# Patient Record
Sex: Male | Born: 1965 | Race: White | Hispanic: No | Marital: Single | Smoking: Current every day smoker
Health system: Southern US, Community
[De-identification: ages and names within clinical notes are randomized; demographics above are authoritative.]

## PROBLEM LIST (undated history)

## (undated) HISTORY — PX: MENISCUS REPAIR: SHX5179

---

## 2016-11-08 ENCOUNTER — Emergency Department (HOSPITAL_COMMUNITY): Payer: BLUE CROSS/BLUE SHIELD | Admitting: Anesthesiology

## 2016-11-08 ENCOUNTER — Encounter (HOSPITAL_COMMUNITY)
Admission: EM | Disposition: A | Discharge status: Home / Self Care | Payer: Self-pay | Source: Home / Self Care | Attending: Emergency Medicine

## 2016-11-08 ENCOUNTER — Encounter (HOSPITAL_COMMUNITY): Payer: Self-pay | Admitting: Emergency Medicine

## 2016-11-08 ENCOUNTER — Observation Stay (HOSPITAL_COMMUNITY)
Admission: EM | Admit: 2016-11-08 | Discharge: 2016-11-09 | Disposition: A | Discharge status: Home / Self Care | Payer: BLUE CROSS/BLUE SHIELD | Attending: General Surgery | Admitting: General Surgery

## 2016-11-08 ENCOUNTER — Emergency Department (HOSPITAL_COMMUNITY): Payer: BLUE CROSS/BLUE SHIELD

## 2016-11-08 DIAGNOSIS — F17210 Nicotine dependence, cigarettes, uncomplicated: Secondary | ICD-10-CM | POA: Diagnosis not present

## 2016-11-08 DIAGNOSIS — R1031 Right lower quadrant pain: Secondary | ICD-10-CM | POA: Diagnosis present

## 2016-11-08 DIAGNOSIS — K352 Acute appendicitis with generalized peritonitis, without abscess: Secondary | ICD-10-CM

## 2016-11-08 DIAGNOSIS — K3580 Unspecified acute appendicitis: Secondary | ICD-10-CM | POA: Diagnosis present

## 2016-11-08 DIAGNOSIS — I10 Essential (primary) hypertension: Secondary | ICD-10-CM | POA: Diagnosis not present

## 2016-11-08 HISTORY — PX: LAPAROSCOPIC APPENDECTOMY: SHX408

## 2016-11-08 LAB — CBC
HCT: 48.9 % (ref 39.0–52.0)
Hemoglobin: 16.5 g/dL (ref 13.0–17.0)
MCH: 31.3 pg (ref 26.0–34.0)
MCHC: 33.7 g/dL (ref 30.0–36.0)
MCV: 92.6 fL (ref 78.0–100.0)
PLATELETS: 333 10*3/uL (ref 150–400)
RBC: 5.28 MIL/uL (ref 4.22–5.81)
RDW: 13.6 % (ref 11.5–15.5)
WBC: 30.3 10*3/uL — AB (ref 4.0–10.5)

## 2016-11-08 LAB — COMPREHENSIVE METABOLIC PANEL
ALK PHOS: 74 U/L (ref 38–126)
ALT: 37 U/L (ref 17–63)
AST: 24 U/L (ref 15–41)
Albumin: 4.6 g/dL (ref 3.5–5.0)
Anion gap: 11 (ref 5–15)
BUN: 10 mg/dL (ref 6–20)
CALCIUM: 9.3 mg/dL (ref 8.9–10.3)
CO2: 25 mmol/L (ref 22–32)
CREATININE: 0.94 mg/dL (ref 0.61–1.24)
Chloride: 97 mmol/L — ABNORMAL LOW (ref 101–111)
Glucose, Bld: 106 mg/dL — ABNORMAL HIGH (ref 65–99)
Potassium: 4 mmol/L (ref 3.5–5.1)
Sodium: 133 mmol/L — ABNORMAL LOW (ref 135–145)
Total Bilirubin: 0.9 mg/dL (ref 0.3–1.2)
Total Protein: 7.5 g/dL (ref 6.5–8.1)

## 2016-11-08 LAB — URINALYSIS, ROUTINE W REFLEX MICROSCOPIC
Bilirubin Urine: NEGATIVE
Glucose, UA: NEGATIVE mg/dL
Hgb urine dipstick: NEGATIVE
Ketones, ur: NEGATIVE mg/dL
LEUKOCYTES UA: NEGATIVE
NITRITE: NEGATIVE
PROTEIN: NEGATIVE mg/dL
Specific Gravity, Urine: 1.013 (ref 1.005–1.030)
pH: 5 (ref 5.0–8.0)

## 2016-11-08 LAB — LIPASE, BLOOD: Lipase: 14 U/L (ref 11–51)

## 2016-11-08 SURGERY — APPENDECTOMY, LAPAROSCOPIC
Anesthesia: General | Site: Abdomen

## 2016-11-08 MED ORDER — ONDANSETRON 4 MG PO TBDP: 4.0000 mg | ORAL_TABLET | Freq: Four times a day (QID) | ORAL | Status: DC | PRN

## 2016-11-08 MED ORDER — PROPOFOL 10 MG/ML IV BOLUS
INTRAVENOUS | Status: AC
  Filled 2016-11-08: qty 20

## 2016-11-08 MED ORDER — HYDROMORPHONE HCL 1 MG/ML IJ SOLN: 1.0000 mg | INTRAMUSCULAR | Status: DC | PRN

## 2016-11-08 MED ORDER — ONDANSETRON HCL 4 MG/2ML IJ SOLN
INTRAMUSCULAR | Status: DC | PRN
  Administered 2016-11-08: 4 mg via INTRAVENOUS

## 2016-11-08 MED ORDER — DEXAMETHASONE SODIUM PHOSPHATE 4 MG/ML IJ SOLN
INTRAMUSCULAR | Status: DC | PRN
  Administered 2016-11-08: 4 mg via INTRAVENOUS

## 2016-11-08 MED ORDER — OXYCODONE-ACETAMINOPHEN 5-325 MG PO TABS: 1.0000 | ORAL_TABLET | ORAL | Status: DC | PRN

## 2016-11-08 MED ORDER — PROPOFOL 10 MG/ML IV BOLUS
INTRAVENOUS | Status: DC | PRN
Start: 2016-11-08 — End: 2016-11-08
  Administered 2016-11-08: 150 mg via INTRAVENOUS

## 2016-11-08 MED ORDER — SODIUM CHLORIDE 0.9 % IR SOLN
Status: DC | PRN
  Administered 2016-11-08: 1000 mL

## 2016-11-08 MED ORDER — CHLORHEXIDINE GLUCONATE CLOTH 2 % EX PADS: 6.0000 | MEDICATED_PAD | Freq: Once | CUTANEOUS | Status: DC

## 2016-11-08 MED ORDER — MIDAZOLAM HCL 5 MG/5ML IJ SOLN
INTRAMUSCULAR | Status: DC | PRN
  Administered 2016-11-08 (×2): 2 mg via INTRAVENOUS

## 2016-11-08 MED ORDER — BUPIVACAINE HCL (PF) 0.5 % IJ SOLN
INTRAMUSCULAR | Status: AC
  Filled 2016-11-08: qty 30

## 2016-11-08 MED ORDER — PIPERACILLIN-TAZOBACTAM 3.375 G IVPB 30 MIN
3.3750 g | Freq: Once | INTRAVENOUS | Status: AC
  Administered 2016-11-08: 3.375 g via INTRAVENOUS
  Filled 2016-11-08: qty 50

## 2016-11-08 MED ORDER — SIMETHICONE 80 MG PO CHEW: 40.0000 mg | CHEWABLE_TABLET | Freq: Four times a day (QID) | ORAL | Status: DC | PRN

## 2016-11-08 MED ORDER — ROCURONIUM BROMIDE 100 MG/10ML IV SOLN
INTRAVENOUS | Status: DC | PRN
  Administered 2016-11-08: 10 mg via INTRAVENOUS
  Administered 2016-11-08: 20 mg via INTRAVENOUS
  Administered 2016-11-08: 10 mg via INTRAVENOUS

## 2016-11-08 MED ORDER — FENTANYL CITRATE (PF) 100 MCG/2ML IJ SOLN
INTRAMUSCULAR | Status: DC | PRN
  Administered 2016-11-08 (×2): 100 ug via INTRAVENOUS

## 2016-11-08 MED ORDER — PIPERACILLIN-TAZOBACTAM 3.375 G IVPB
3.3750 g | Freq: Three times a day (TID) | INTRAVENOUS | Status: DC
  Administered 2016-11-08: 3.375 g via INTRAVENOUS
  Filled 2016-11-08 (×2): qty 50

## 2016-11-08 MED ORDER — FENTANYL CITRATE (PF) 250 MCG/5ML IJ SOLN
INTRAMUSCULAR | Status: AC
  Filled 2016-11-08: qty 5

## 2016-11-08 MED ORDER — ENOXAPARIN SODIUM 40 MG/0.4ML ~~LOC~~ SOLN: 40.0000 mg | SUBCUTANEOUS | Status: DC

## 2016-11-08 MED ORDER — DIPHENHYDRAMINE HCL 25 MG PO CAPS: 25.0000 mg | ORAL_CAPSULE | Freq: Four times a day (QID) | ORAL | Status: DC | PRN

## 2016-11-08 MED ORDER — LORAZEPAM 2 MG/ML IJ SOLN: 1.0000 mg | INTRAMUSCULAR | Status: DC | PRN

## 2016-11-08 MED ORDER — LACTATED RINGERS IV SOLN
INTRAVENOUS | Status: DC | PRN
  Administered 2016-11-08: 19:00:00 via INTRAVENOUS

## 2016-11-08 MED ORDER — BUPIVACAINE HCL (PF) 0.5 % IJ SOLN
INTRAMUSCULAR | Status: DC | PRN
  Administered 2016-11-08: 10 mL

## 2016-11-08 MED ORDER — METOCLOPRAMIDE HCL 5 MG/ML IJ SOLN
INTRAMUSCULAR | Status: DC | PRN
  Administered 2016-11-08: 30 mg via INTRAVENOUS

## 2016-11-08 MED ORDER — DIPHENHYDRAMINE HCL 50 MG/ML IJ SOLN: 25.0000 mg | Freq: Four times a day (QID) | INTRAMUSCULAR | Status: DC | PRN

## 2016-11-08 MED ORDER — LACTATED RINGERS IV SOLN
INTRAVENOUS | Status: DC
  Administered 2016-11-08: 23:00:00 via INTRAVENOUS

## 2016-11-08 MED ORDER — ONDANSETRON HCL 4 MG/2ML IJ SOLN: 4.0000 mg | Freq: Four times a day (QID) | INTRAMUSCULAR | Status: DC | PRN

## 2016-11-08 MED ORDER — HYDROMORPHONE HCL 1 MG/ML IJ SOLN
1.0000 mg | Freq: Once | INTRAMUSCULAR | Status: DC
  Filled 2016-11-08: qty 1

## 2016-11-08 MED ORDER — MIDAZOLAM HCL 2 MG/2ML IJ SOLN
INTRAMUSCULAR | Status: AC
  Filled 2016-11-08: qty 4

## 2016-11-08 MED ORDER — HEMOSTATIC AGENTS (NO CHARGE) OPTIME
TOPICAL | Status: DC | PRN
Start: 2016-11-08 — End: 2016-11-08
  Administered 2016-11-08: 1 via TOPICAL

## 2016-11-08 MED ORDER — HYDROMORPHONE HCL 1 MG/ML IJ SOLN
0.5000 mg | Freq: Once | INTRAMUSCULAR | Status: AC
  Administered 2016-11-08: 0.5 mg via INTRAVENOUS
  Filled 2016-11-08: qty 1

## 2016-11-08 MED ORDER — ACETAMINOPHEN 325 MG PO TABS: 650.0000 mg | ORAL_TABLET | Freq: Four times a day (QID) | ORAL | Status: DC | PRN

## 2016-11-08 MED ORDER — POVIDONE-IODINE 10 % OINT PACKET
TOPICAL_OINTMENT | CUTANEOUS | Status: DC | PRN
  Administered 2016-11-08: 1 via TOPICAL

## 2016-11-08 MED ORDER — SUCCINYLCHOLINE CHLORIDE 20 MG/ML IJ SOLN
INTRAMUSCULAR | Status: DC | PRN
  Administered 2016-11-08: 150 mg via INTRAVENOUS

## 2016-11-08 MED ORDER — IOPAMIDOL (ISOVUE-300) INJECTION 61%
100.0000 mL | Freq: Once | INTRAVENOUS | Status: AC | PRN
  Administered 2016-11-08: 100 mL via INTRAVENOUS

## 2016-11-08 MED ORDER — NEOSTIGMINE METHYLSULFATE 10 MG/10ML IV SOLN
INTRAVENOUS | Status: AC
  Filled 2016-11-08: qty 1

## 2016-11-08 MED ORDER — KETOROLAC TROMETHAMINE 30 MG/ML IJ SOLN
30.0000 mg | Freq: Once | INTRAMUSCULAR | Status: AC
Start: 2016-11-08 — End: 2016-11-08
  Administered 2016-11-08: 30 mg via INTRAVENOUS
  Filled 2016-11-08: qty 1

## 2016-11-08 MED ORDER — HYDROMORPHONE HCL 1 MG/ML IJ SOLN
0.5000 mg | Freq: Once | INTRAMUSCULAR | Status: AC
  Administered 2016-11-08: 0.5 mg via INTRAVENOUS

## 2016-11-08 MED ORDER — PROCHLORPERAZINE EDISYLATE 5 MG/ML IJ SOLN
5.0000 mg | Freq: Once | INTRAMUSCULAR | Status: AC
  Administered 2016-11-08: 5 mg via INTRAVENOUS
  Filled 2016-11-08: qty 2

## 2016-11-08 MED ORDER — LIDOCAINE HCL 1 % IJ SOLN
INTRAMUSCULAR | Status: DC | PRN
  Administered 2016-11-08: 30 mg via INTRADERMAL

## 2016-11-08 MED ORDER — ACETAMINOPHEN 650 MG RE SUPP: 650.0000 mg | Freq: Four times a day (QID) | RECTAL | Status: DC | PRN

## 2016-11-08 MED ORDER — SODIUM CHLORIDE 0.9 % IV SOLN
1000.0000 mL | INTRAVENOUS | Status: DC
  Administered 2016-11-08: 1000 mL via INTRAVENOUS

## 2016-11-08 MED ORDER — SODIUM CHLORIDE 0.9 % IV SOLN
1000.0000 mL | Freq: Once | INTRAVENOUS | Status: AC
  Administered 2016-11-08: 1000 mL via INTRAVENOUS

## 2016-11-08 MED ORDER — NEOSTIGMINE METHYLSULFATE 10 MG/10ML IV SOLN
INTRAVENOUS | Status: DC | PRN
  Administered 2016-11-08 (×2): 2 mg via INTRAVENOUS

## 2016-11-08 MED ORDER — IOPAMIDOL (ISOVUE-300) INJECTION 61%
INTRAVENOUS | Status: AC
Start: 2016-11-08 — End: 2016-11-09
  Filled 2016-11-08: qty 30

## 2016-11-08 MED ORDER — POVIDONE-IODINE 10 % EX OINT
TOPICAL_OINTMENT | CUTANEOUS | Status: AC
  Filled 2016-11-08: qty 1

## 2016-11-08 SURGICAL SUPPLY — 47 items
APPLICATOR ARISTA FLEXITIP XL (MISCELLANEOUS) ×3 IMPLANT
BAG HAMPER (MISCELLANEOUS) ×3 IMPLANT
CHLORAPREP W/TINT 26ML (MISCELLANEOUS) ×3 IMPLANT
CLOTH BEACON ORANGE TIMEOUT ST (SAFETY) ×3 IMPLANT
COVER LIGHT HANDLE STERIS (MISCELLANEOUS) ×6 IMPLANT
CUTTER FLEX LINEAR 45M (STAPLE) ×3 IMPLANT
DECANTER SPIKE VIAL GLASS SM (MISCELLANEOUS) ×3 IMPLANT
ELECT REM PT RETURN 9FT ADLT (ELECTROSURGICAL) ×3
ELECTRODE REM PT RTRN 9FT ADLT (ELECTROSURGICAL) ×1 IMPLANT
EVACUATOR SMOKE 8.L (FILTER) ×3 IMPLANT
FORMALIN 10 PREFIL 120ML (MISCELLANEOUS) ×3 IMPLANT
GLOVE BIOGEL PI IND STRL 7.0 (GLOVE) ×1 IMPLANT
GLOVE BIOGEL PI INDICATOR 7.0 (GLOVE) ×2
GLOVE ECLIPSE 6.5 STRL STRAW (GLOVE) ×3 IMPLANT
GLOVE EXAM NITRILE MD LF STRL (GLOVE) ×3 IMPLANT
GLOVE SURG SS PI 7.5 STRL IVOR (GLOVE) ×3 IMPLANT
GOWN STRL REUS W/ TWL XL LVL3 (GOWN DISPOSABLE) ×1 IMPLANT
GOWN STRL REUS W/TWL LRG LVL3 (GOWN DISPOSABLE) ×3 IMPLANT
GOWN STRL REUS W/TWL XL LVL3 (GOWN DISPOSABLE) ×2
HEMOSTAT ARISTA ABSORB 1G (MISCELLANEOUS) ×3 IMPLANT
INST SET LAPROSCOPIC AP (KITS) ×3 IMPLANT
IV NS IRRIG 3000ML ARTHROMATIC (IV SOLUTION) IMPLANT
KIT ROOM TURNOVER APOR (KITS) ×3 IMPLANT
MANIFOLD NEPTUNE II (INSTRUMENTS) ×3 IMPLANT
NEEDLE INSUFFLATION 14GA 120MM (NEEDLE) ×3 IMPLANT
NS IRRIG 1000ML POUR BTL (IV SOLUTION) ×3 IMPLANT
PACK LAP CHOLE LZT030E (CUSTOM PROCEDURE TRAY) ×3 IMPLANT
PAD ARMBOARD 7.5X6 YLW CONV (MISCELLANEOUS) ×3 IMPLANT
PENCIL HANDSWITCHING (ELECTRODE) ×6 IMPLANT
POUCH SPECIMEN RETRIEVAL 10MM (ENDOMECHANICALS) ×3 IMPLANT
RELOAD 45 VASCULAR/THIN (ENDOMECHANICALS) ×3 IMPLANT
RELOAD STAPLE TA45 3.5 REG BLU (ENDOMECHANICALS) IMPLANT
SET BASIN LINEN APH (SET/KITS/TRAYS/PACK) ×3 IMPLANT
SET TUBE IRRIG SUCTION NO TIP (IRRIGATION / IRRIGATOR) IMPLANT
SHEARS HARMONIC ACE PLUS 36CM (ENDOMECHANICALS) ×3 IMPLANT
SPONGE GAUZE 2X2 8PLY STER LF (GAUZE/BANDAGES/DRESSINGS) ×3
SPONGE GAUZE 2X2 8PLY STRL LF (GAUZE/BANDAGES/DRESSINGS) ×6 IMPLANT
STAPLER VISISTAT (STAPLE) ×3 IMPLANT
SUT VICRYL 0 UR6 27IN ABS (SUTURE) ×3 IMPLANT
TAPE CLOTH SURG 4X10 WHT LF (GAUZE/BANDAGES/DRESSINGS) ×3 IMPLANT
TRAY FOLEY CATH SILVER 16FR (SET/KITS/TRAYS/PACK) ×3 IMPLANT
TROCAR ENDO BLADELESS 11MM (ENDOMECHANICALS) ×3 IMPLANT
TROCAR ENDO BLADELESS 12MM (ENDOMECHANICALS) ×3 IMPLANT
TROCAR XCEL NON-BLD 5MMX100MML (ENDOMECHANICALS) ×3 IMPLANT
TUBING INSUFFLATION (TUBING) ×3 IMPLANT
WARMER LAPAROSCOPE (MISCELLANEOUS) ×3 IMPLANT
YANKAUER SUCT 12FT TUBE ARGYLE (SUCTIONS) ×3 IMPLANT

## 2016-11-08 NOTE — ED Provider Notes (Signed)
[facility] DEPT Provider Note   CSN: [id number] Arrival date & time: 11/08/16  1214     History   Chief Complaint Chief Complaint  Patient presents with  . Abdominal Pain    HPI Tyrone Sanchez is a 51 y.o. male.  Patient is a 51 year old male who presents to the emergency department with a complaint of abdominal pain.  The patient states that his pain started around midnight on mostly in the middle of his abdomen. Around 6:30 in the morning the pain went to the right and left lower quadrant and then settled in the right lower quadrant. The pain was intense for approximately 40 minutes, and then it settled down. The patient was able to drive for couple of hours he made it to the emergency department and then the pain began to bother him again on. That has now subsided some. The patient denies vomiting or diarrhea, he has had some mild nausea. There's been no fever reported. It is of note that the patient had flu symptoms on January 9, but he states these have resolved. He's not seen any blood in his stool. He had no bowel movement today which is unusual for him, as he usually goes daily. He's not had any abdominal surgery. He denies having any foods that he thought were ill prepared with spoiled. He's had no previous history of abdominal pain. No other pain at this time. He presents now for assistance with this issue.   The history is provided by the patient.  Abdominal Pain   Associated symptoms include nausea. Pertinent negatives include diarrhea, vomiting, dysuria, frequency, hematuria and arthralgias.    History reviewed. No pertinent past medical history.  There are no active problems to display for this patient.   Past Surgical History:  Procedure Laterality Date  . MENISCUS REPAIR         Home Medications    Prior to Admission medications   Not on File    Family History History reviewed. No pertinent family history.  Social History Social History  Substance  Use Topics  . Smoking status: Current Every Day Smoker    Packs/day: 1.00    Types: Cigarettes  . Smokeless tobacco: Not on file  . Alcohol use Yes     Comment: occ     Allergies   Patient has no known allergies.   Review of Systems Review of Systems  Constitutional: Negative for activity change.       All ROS Neg except as noted in HPI  HENT: Negative for nosebleeds.   Eyes: Negative for photophobia and discharge.  Respiratory: Negative for cough, shortness of breath and wheezing.   Cardiovascular: Negative for chest pain and palpitations.  Gastrointestinal: Positive for abdominal pain and nausea. Negative for blood in stool, diarrhea and vomiting.  Genitourinary: Negative for dysuria, frequency and hematuria.  Musculoskeletal: Negative for arthralgias, back pain and neck pain.  Skin: Negative.   Neurological: Negative for dizziness, seizures and speech difficulty.  Psychiatric/Behavioral: Negative for confusion and hallucinations.     Physical Exam Updated Vital Signs BP 131/87   Pulse 97   Temp 98.2 F (36.8 C)   Resp 18   Ht 6' (1.829 m)   Wt 100.1 kg   SpO2 97%   BMI 29.93 kg/m   Physical Exam  Constitutional: He is oriented to person, place, and time. He appears well-developed and well-nourished.  Non-toxic appearance.  HENT:  Head: Normocephalic.  Right Ear: Tympanic membrane and external ear normal.  Left Ear: Tympanic membrane and external ear normal.  Eyes: EOM and lids are normal. Pupils are equal, round, and reactive to light.  Neck: Normal range of motion. Neck supple. Carotid bruit is not present.  Cardiovascular: Normal rate, regular rhythm, normal heart sounds, intact distal pulses and normal pulses.   Pulmonary/Chest: Breath sounds normal. No respiratory distress.  Abdominal: Soft. Bowel sounds are normal. There is no splenomegaly or hepatomegaly. There is generalized tenderness. There is guarding and tenderness at McBurney's point.    Musculoskeletal: Normal range of motion.  Lymphadenopathy:       Head (right side): No submandibular adenopathy present.       Head (left side): No submandibular adenopathy present.    He has no cervical adenopathy.  Neurological: He is alert and oriented to person, place, and time. He has normal strength. No cranial nerve deficit or sensory deficit.  Skin: Skin is warm and dry.  Psychiatric: He has a normal mood and affect. His speech is normal.  Nursing note and vitals reviewed.    ED Treatments / Results  Labs (all labs ordered are listed, but only abnormal results are displayed) Labs Reviewed  COMPREHENSIVE METABOLIC PANEL - Abnormal; Notable for the following:       Result Value   Sodium 133 (*)    Chloride 97 (*)    Glucose, Bld 106 (*)    All other components within normal limits  CBC - Abnormal; Notable for the following:    WBC 30.3 (*)    All other components within normal limits  LIPASE, BLOOD  URINALYSIS, ROUTINE W REFLEX MICROSCOPIC    EKG  EKG Interpretation None       Radiology No results found.  Procedures Procedures (including critical care time)  Medications Ordered in ED Medications  prochlorperazine (COMPAZINE) injection 5 mg (not administered)  0.9 %  sodium chloride infusion (not administered)    Followed by  0.9 %  sodium chloride infusion (not administered)  HYDROmorphone (DILAUDID) injection 0.5 mg (not administered)     Initial Impression / Assessment and Plan / ED Course  I have reviewed the triage vital signs and the nursing notes.  Pertinent labs & imaging results that were available during my care of the patient were reviewed by me and considered in my medical decision making (see chart for details).     *I have reviewed nursing notes, vital signs, and all appropriate lab and imaging results for this patient.**  Final Clinical Impressions(s) / ED Diagnoses  MDM Vital signs reviewed.  Patient complains of abdominal pain  that started in the periumbilical area, and then spread to right and left lower quadrant with more right lower quadrant intensity. Patient will be evaluated for urinary tract infection, kidney stone, appendicitis, bowel obstruction, abdominal mass, peritonitis.   Competence of metabolic panel shows the sodium and chloride to be slightly low, but otherwise components of metabolic panel was within normal limits. The complete blood count shows the white blood cells to be elevated at 30,300. Urinalysis is negative. Lipase is also normal.  Patient treated with IV pain and nausea medicine.  We'll obtain a CT abdomen and pelvis with contrast.   Patient states he feels better after IV pain medication.  CT abdomen and pelvis with contrast reveals hyper enhancing and mildly dilated appendix surrounding by fat stranding, consistent with acute appendicitis. There is no evidence of perforation at this time. It is also of note there is an 8mm right lower lobe pulmonary nodule  present. Follow-up in 6-12 months of the nodule was recommended. There's also some aortic atherosclerosis noted on the examination. I've made the patient aware of the examination findings, as well as the CT scan findings.  Call placed to Dr. Lovell Sheehan for general surgery. Dr. Lovell Sheehan will see the patient in the emergency department for possible laparoscopic appendectomy. IV Zosyn started.    Final diagnoses:  Acute appendicitis with generalized peritonitis    New Prescriptions New Prescriptions   No medications on file     Tyrone Quale, PA-C 11/08/16 1729    Donnetta Hutching, MD 11/09/16 [phone]

## 2016-11-08 NOTE — ED Notes (Signed)
Notified CT that patient has finished drinking contrast.

## 2016-11-08 NOTE — Anesthesia Postprocedure Evaluation (Signed)
Anesthesia Post Note  Patient: Tyrone Sanchez  Procedure(s) Performed: Procedure(s) (LRB): APPENDECTOMY LAPAROSCOPIC (N/A)  Patient location during evaluation: PACU Anesthesia Type: General Level of consciousness: awake and patient cooperative Pain management: pain level controlled Vital Signs Assessment: post-procedure vital signs reviewed and stable Respiratory status: spontaneous breathing, nonlabored ventilation and respiratory function stable Cardiovascular status: blood pressure returned to baseline Postop Assessment: no signs of nausea or vomiting Anesthetic complications: no     Last Vitals:  Vitals:   11/08/16 1730 11/08/16 1845  BP: 124/84 139/72  Pulse: 106 93  Resp: 20 (!) 21  Temp:  37.2 C    Last Pain:  Vitals:   11/08/16 1845  PainSc: Asleep                 [name]

## 2016-11-08 NOTE — Transfer of Care (Signed)
Immediate Anesthesia Transfer of Care Note  Patient: Tyrone Sanchez  Procedure(s) Performed: Procedure(s): APPENDECTOMY LAPAROSCOPIC (N/A)  Patient Location: PACU  Anesthesia Type:General  Level of Consciousness: awake and patient cooperative  Airway & Oxygen Therapy: Patient Spontanous Breathing and Patient connected to face mask oxygen  Post-op Assessment: Report given to RN, Post -op Vital signs reviewed and stable and Patient moving all extremities  Post vital signs: Reviewed and stable  Last Vitals:  Vitals:   11/08/16 1700 11/08/16 1730  BP: 132/87 124/84  Pulse: 101 106  Resp: (!) 28 20  Temp:      Last Pain:  Vitals:   11/08/16 1506  PainSc: 3          Complications: No apparent anesthesia complications

## 2016-11-08 NOTE — ED Notes (Signed)
OR team at bedside.

## 2016-11-08 NOTE — Op Note (Signed)
Patient:  Tyrone Sanchez  DOB:  11/20/1965  MRN:  [id number]   Preop Diagnosis:  Acute appendicitis  Postop Diagnosis:  Same  Procedure:  Laparoscopic appendectomy  Surgeon:  Franky MachoMark Marquis Down, M.D.  Anes:  Gen. endotracheal  Indications:  Patient is a 51 year old white male who presents with a less than 24-hour history of worsening right lower quadrant abdominal pain. CT scan of the abdomen revealed acute appendicitis. The risks and benefits of the procedure including bleeding, infection, and the possibility of an open procedure were fully explained to the patient, who gave informed consent.  Procedure note:  The patient was placed in supine position. After induction of general endotracheal anesthesia, the abdomen was prepped and draped using the usual sterile technique with DuraPrep. Surgical site confirmation was performed.  A supraumbilical incision was made down the fascia. A Veress needle was introduced into the abdominal cavity and confirmation of placement was done using the saline drop test. The abdomen was then insufflated to 16 mmHg pressure. An 11 mm trocar was introduced into the abdominal cavity under direct visualization without difficulty. The patient was placed in deeper Trendelenburg position and an additional 12 mm trocar was placed in the suprapubic region and a 5 mm trocar was placed in the right lower quadrant region. The appendix was visualized and noted to be acutely inflamed. The resultant appendix was divided using the Harmonic scalpel. A vascular Endo GIA was placed across the base the appendix and fired. The appendix was then removed using an Endo Catch bag without difficulty. The staple line was inspected and noted to be within normal limits.  Arista was placed in that region. All fluid and air were then evacuated from the abdominal cavity prior to the removal of the trochars.  All wounds were irrigated with normal saline. All wounds were injected with 0.5% Sensorcaine. The  supraumbilical fascia was reapproximated using an 0 Vicryl interrupted suture. All skin incisions were closed using staples. Betadine ointment and dry sterile dressings were applied.  All tape and needle counts were correct at the end of the procedure. The patient was extubated in the operating room and transferred to PACU in stable condition.  Complications:  None  EBL:  Minimal  Specimen:  Appendix

## 2016-11-08 NOTE — Anesthesia Procedure Notes (Signed)
Procedure Name: Intubation Date/Time: 11/08/2016 5:50 PM Performed by: Charmaine Downs Pre-anesthesia Checklist: Patient identified, Patient being monitored, Timeout performed, Emergency Drugs available and Suction available Patient Re-evaluated:Patient Re-evaluated prior to inductionOxygen Delivery Method: Circle System Utilized Preoxygenation: Pre-oxygenation with 100% oxygen Intubation Type: IV induction, Rapid sequence and Cricoid Pressure applied Ventilation: Mask ventilation without difficulty Laryngoscope Size: Mac and 3 Grade View: Grade II Tube type: Oral Tube size: 7.0 mm Number of attempts: 1 Airway Equipment and Method: stylet Placement Confirmation: ETT inserted through vocal cords under direct vision,  positive ETCO2 and breath sounds checked- equal and bilateral Secured at: 22 cm Tube secured with: Tape Dental Injury: Teeth and Oropharynx as per pre-operative assessment

## 2016-11-08 NOTE — ED Notes (Signed)
Patient requesting pain medication. States "I'm starting to get uncomfortable again." Verbal order for 0.5 mg Dilaudid, IV to be given.

## 2016-11-08 NOTE — ED Triage Notes (Signed)
Pt reports generalized abd pain starting at midnight, radiating to right lower quadrant with nausea. Denies v/d.  Last bm yesterday, states he usually goes daily but has been unable to go today.

## 2016-11-08 NOTE — Anesthesia Preprocedure Evaluation (Addendum)
Anesthesia Evaluation  Patient identified by MRN, date of birth, ID band Patient awake    Reviewed: Allergy & Precautions, NPO status , Patient's Chart, lab work & pertinent test results  Airway Mallampati: I   Neck ROM: Full    Dental no notable dental hx. (+) Teeth Intact   Pulmonary Current Smoker,    breath sounds clear to auscultation       Cardiovascular Exercise Tolerance: Good hypertension,  Rhythm:Regular Rate:Tachycardia     Neuro/Psych Anxiety    GI/Hepatic (+)     (-) substance abuse  ,   Endo/Other    Renal/GU      Musculoskeletal   Abdominal (+)  Abdomen: soft. Bowel sounds: absent.  Peds  Hematology   Anesthesia Other Findings   Reproductive/Obstetrics                             Anesthesia Physical Anesthesia Plan  ASA: I and emergent  Anesthesia Plan: General   Post-op Pain Management:    Induction: Intravenous, Rapid sequence and Cricoid pressure planned  Airway Management Planned: Oral ETT  Additional Equipment:   Intra-op Plan:   Post-operative Plan: Extubation in OR  Informed Consent:   Dental advisory given  Plan Discussed with: CRNA, Anesthesiologist and Surgeon  Anesthesia Plan Comments:         Anesthesia Quick Evaluation

## 2016-11-08 NOTE — H&P (Signed)
Tyrone Sanchez is an 51 y.o. male.   Chief Complaint: Right lower quadrant abdominal pain HPI: Tyrone Sanchez is a 51 year old white male who woke up approximately 17 hours ago with periumbilical abdominal pain. As the day progressed, the pain localized to the right lower quadrant. It was made worse with movement or palpation. No fever or chills were noted. No diarrhea was noted.  CT scan of the abdomen reveals acute appendicitis.  History reviewed. No pertinent past medical history.  Past Surgical History:  Procedure Laterality Date  . MENISCUS REPAIR      History reviewed. No pertinent family history. Social History:  reports that he has been smoking Cigarettes.  He has been smoking about 1.00 pack per day. He does not have any smokeless tobacco history on file. He reports that he drinks alcohol. He reports that he does not use drugs.  Allergies: No Known Allergies   (Not in a hospital admission)  Results for orders placed or performed during the hospital encounter of 11/08/16 (from the past 48 hour(s))  Urinalysis, Routine w reflex microscopic     Status: None   Collection Time: 11/08/16 12:23 PM  Result Value Ref Range   Color, Urine YELLOW YELLOW   APPearance CLEAR CLEAR   Specific Gravity, Urine 1.013 1.005 - 1.030   pH 5.0 5.0 - 8.0   Glucose, UA NEGATIVE NEGATIVE mg/dL   Hgb urine dipstick NEGATIVE NEGATIVE   Bilirubin Urine NEGATIVE NEGATIVE   Ketones, ur NEGATIVE NEGATIVE mg/dL   Protein, ur NEGATIVE NEGATIVE mg/dL   Nitrite NEGATIVE NEGATIVE   Leukocytes, UA NEGATIVE NEGATIVE  Lipase, blood     Status: None   Collection Time: 11/08/16  1:25 PM  Result Value Ref Range   Lipase 14 11 - 51 U/L  Comprehensive metabolic panel     Status: Abnormal   Collection Time: 11/08/16  1:25 PM  Result Value Ref Range   Sodium 133 (L) 135 - 145 mmol/L   Potassium 4.0 3.5 - 5.1 mmol/L   Chloride 97 (L) 101 - 111 mmol/L   CO2 25 22 - 32 mmol/L   Glucose, Bld 106 (H) 65 - 99 mg/dL   BUN  10 6 - 20 mg/dL   Creatinine, Ser 0.94 0.61 - 1.24 mg/dL   Calcium 9.3 8.9 - 10.3 mg/dL   Total Protein 7.5 6.5 - 8.1 g/dL   Albumin 4.6 3.5 - 5.0 g/dL   AST 24 15 - 41 U/L   ALT 37 17 - 63 U/L   Alkaline Phosphatase 74 38 - 126 U/L   Total Bilirubin 0.9 0.3 - 1.2 mg/dL   GFR calc non Af Amer >60 >60 mL/min   GFR calc Af Amer >60 >60 mL/min    Comment: (NOTE) The eGFR has been calculated using the CKD EPI equation. This calculation has not been validated in all clinical situations. eGFR's persistently <60 mL/min signify possible Chronic Kidney Disease.    Anion gap 11 5 - 15  CBC     Status: Abnormal   Collection Time: 11/08/16  1:25 PM  Result Value Ref Range   WBC 30.3 (H) 4.0 - 10.5 K/uL   RBC 5.28 4.22 - 5.81 MIL/uL   Hemoglobin 16.5 13.0 - 17.0 g/dL   HCT 48.9 39.0 - 52.0 %   MCV 92.6 78.0 - 100.0 fL   MCH 31.3 26.0 - 34.0 pg   MCHC 33.7 30.0 - 36.0 g/dL   RDW 13.6 11.5 - 15.5 %   Platelets 333  150 - 400 K/uL   Ct Abdomen Pelvis W Contrast  Result Date: 11/08/2016 CLINICAL DATA:  Tyrone Sanchez with right lower quadrant pain. EXAM: CT ABDOMEN AND PELVIS WITH CONTRAST TECHNIQUE: Multidetector CT imaging of the abdomen and pelvis was performed using the standard protocol following bolus administration of intravenous contrast. CONTRAST:  180m ISOVUE-300 IOPAMIDOL (ISOVUE-300) INJECTION 61% COMPARISON:  None. FINDINGS: Lower chest: Normal heart size. Dependent ground-glass opacities within the lower lobes bilaterally suggestive of atelectasis. In the right lower lobe there is a 8 x 8 mm nodule (8 mm mean diameter) on image 13 of series 3. Hepatobiliary: Too small to characterize subcentimeter low-attenuation lesions within the left hepatic lobe, potentially representing small hepatic cysts (image 23; series 2). Gallbladder is unremarkable. No intrahepatic or extrahepatic biliary ductal dilatation. Pancreas: Unremarkable Spleen: Unremarkable Adrenals/Urinary Tract: The adrenal glands are  normal. Kidneys enhance symmetrically with contrast. No hydronephrosis. Urinary bladder is unremarkable. Stomach/Bowel: The appendix is dilated and hyperenhancing measuring up to 11 mm (image 49; series 5). There is a small amount of surrounding fat stranding and fluid. Normal morphology of the stomach. No free intraperitoneal air. Vascular/Lymphatic: Normal caliber abdominal aorta. Peripheral calcified atherosclerotic plaque. No retroperitoneal lymphadenopathy. Reproductive: Small right fat containing inguinal hernia. Other: None. Musculoskeletal: No aggressive or acute appearing osseous lesions. Lumbar spine degenerative changes. IMPRESSION: The appendix is hyperenhancing and mildly dilated with surrounding fat stranding suggestive of acute appendicitis. No evidence for perforation. 8 mm right lower lobe pulmonary nodule. Non-contrast chest CT at 6-12 months is recommended. If the nodule is stable at time of repeat CT, then future CT at 18-24 months (from today's scan) is considered optional for low-risk patients, but is recommended for high-risk patients. This recommendation follows the consensus statement: Guidelines for Management of Incidental Pulmonary Nodules Detected on CT Images: From the Fleischner Society 2017; Radiology 2017; 284:228-243. Aortic atherosclerosis. Fat containing right inguinal hernia. Electronically Signed   By: DLovey NewcomerM.D.   On: 11/08/2016 15:39   Dg Chest Portable 1 View  Result Date: 11/08/2016 CLINICAL DATA:  Tyrone Sanchez states he had the flu a about 3 weeks ago and is now feeling better. Dr. SQuintella Batonpatient has acute appendicitis and will likely go to the OR. Current smoker: 1 pack a day. EXAM: PORTABLE CHEST 1 VIEW COMPARISON:  Today's abdominal CT. FINDINGS: Midline trachea. Normal heart size for level of inspiration. No pleural effusion or pneumothorax. Mild bibasilar volume loss and subsegmental atelectasis. No congestive failure. No lobar consolidation. IMPRESSION: Low lung  volumes, without acute disease. Electronically Signed   By: KAbigail MiyamotoM.D.   On: 11/08/2016 16:32    Review of Systems  Constitutional: Positive for malaise/fatigue. Negative for chills and fever.  Eyes: Negative.   Respiratory: Negative.   Cardiovascular: Negative.   Gastrointestinal: Positive for abdominal pain and nausea. Negative for constipation, diarrhea and vomiting.  Genitourinary: Negative.   Musculoskeletal: Negative.   Skin: Negative.   Neurological: Negative.   Endo/Heme/Allergies: Negative.     Blood pressure 132/87, pulse 101, temperature 98.2 F (36.8 C), resp. rate (!) 28, height 6' (1.829 m), weight 100.1 kg (220 lb 11.2 oz), SpO2 95 %. Physical Exam  Vitals reviewed. Constitutional: He is oriented to person, place, and time. He appears well-developed and well-nourished.  HENT:  Head: Normocephalic and atraumatic.  Neck: Normal range of motion. Neck supple.  Cardiovascular: Normal rate, regular rhythm and normal heart sounds.   Respiratory: Effort normal and breath sounds normal.  GI: Soft. He exhibits no  distension. There is tenderness. There is no rebound.  Tender in the right lower quadrant to deep palpation. No rigidity noted.  Musculoskeletal: Normal range of motion.  Neurological: He is alert and oriented to person, place, and time.  Skin: Skin is warm and dry.     Assessment/Plan Impression: Acute appendicitis Plan: Tyrone Sanchez be taken to the operating room for laparoscopic appendectomy. The risks and benefits of the procedure including bleeding, infection, and the possibility of an open procedure were fully explained to the Tyrone Sanchez, who gave informed consent.  Jamesetta So, MD 11/08/2016, 5:14 PM

## 2016-11-08 NOTE — ED Notes (Signed)
Assisted patient to bathroom. Patient ambulated with one assist at side. Gait steady during ambulation.

## 2016-11-08 NOTE — ED Notes (Signed)
Patient to OR

## 2016-11-08 NOTE — ED Notes (Signed)
Medication order initially for dilaudid 1 mg IV to be given. Removed medication from pyxis. No waste warranted in pyxis due to entire dose to be given. After removal of medication, but prior to administering medication, it was noted that dose had changed to 0.5 mg of dilaudid IV to be given. Wasted 0.5 mg in sink in room prior to giving medication to patient. Patient only received 0.5 mg IV at this time. Later attempted to waste in pyxis but patient is not found in pyxis. No discrepancies noted at this time. Order for 1 mg dilaudid IV was not given.

## 2016-11-09 ENCOUNTER — Encounter (HOSPITAL_COMMUNITY): Payer: Self-pay

## 2016-11-09 LAB — CBC
HEMATOCRIT: 40.9 % (ref 39.0–52.0)
Hemoglobin: 13.9 g/dL (ref 13.0–17.0)
MCH: 31.7 pg (ref 26.0–34.0)
MCHC: 34 g/dL (ref 30.0–36.0)
MCV: 93.4 fL (ref 78.0–100.0)
PLATELETS: 264 10*3/uL (ref 150–400)
RBC: 4.38 MIL/uL (ref 4.22–5.81)
RDW: 13.7 % (ref 11.5–15.5)
WBC: 15.2 10*3/uL — AB (ref 4.0–10.5)

## 2016-11-09 LAB — BASIC METABOLIC PANEL
Anion gap: 7 (ref 5–15)
BUN: 11 mg/dL (ref 6–20)
CHLORIDE: 104 mmol/L (ref 101–111)
CO2: 24 mmol/L (ref 22–32)
CREATININE: 0.88 mg/dL (ref 0.61–1.24)
Calcium: 8.5 mg/dL — ABNORMAL LOW (ref 8.9–10.3)
GFR calc Af Amer: >60 mL/min (ref >60)
GFR calc non Af Amer: >60 mL/min (ref >60)
GLUCOSE: 121 mg/dL — AB (ref 65–99)
POTASSIUM: 3.9 mmol/L (ref 3.5–5.1)
SODIUM: 135 mmol/L (ref 135–145)

## 2016-11-09 MED ORDER — CIPROFLOXACIN HCL 500 MG PO TABS: 500.0000 mg | ORAL_TABLET | Freq: Two times a day (BID) | ORAL | 0 refills | Status: AC

## 2016-11-09 MED ORDER — HYDROCODONE-ACETAMINOPHEN 5-325 MG PO TABS: 1.0000 | ORAL_TABLET | ORAL | 0 refills | Status: AC | PRN

## 2016-11-09 NOTE — Progress Notes (Signed)
Pt discharged in stable condition into the care of his family via wheelchair via private vehicle.  PIV removed intact and w/o S&S of complications.  Discharge instructions reviewed with pt.  Pt verbalized understanding.  

## 2016-11-09 NOTE — Discharge Instructions (Signed)
Laparoscopic Appendectomy, Adult, Care After Introduction These instructions give you information about caring for yourself after your procedure. Your doctor may also give you more specific instructions. Call your doctor if you have any problems or questions after your procedure. Follow these instructions at home: Medicines  Take over-the-counter and prescription medicines only as told by your doctor.  Do not drive for 24 hours if you received a sedative.  Do not drive or use heavy machinery while taking prescription pain medicine.  If you were prescribed an antibiotic medicine, take it as told by your doctor. Do not stop taking it even if you start to feel better. Activity  Do not lift anything that is heavier than 10 pounds (4.5 kg) for 3 weeks or as told by your doctor.  Do not play contact sports for 3 weeks or as told by your doctor.  Slowly return to your normal activities. Bathing  Keep your cuts from surgery (incisions) clean and dry.  Gently wash the cuts with soap and water.  Rinse the cuts with water until the soap is gone.  Pat the cuts dry with a clean towel. Do not rub the cuts.  You may take showers after 48 hours.  Do not take baths, swim, or use a hot tub for 2 weeks or as told by your doctor. Cut Care  Follow instructions from your doctor about how to take care of your cuts. Make sure you:  Wash your hands with soap and water before you change your bandage (dressing). If you do not have soap and water, use hand sanitizer.  Change your bandage as told by your doctor.  Leave stitches (sutures), skin glue, or skin tape (adhesive) strips in place. They may need to stay in place for 2 weeks or longer. If tape strips get loose and curl up, you may trim the loose edges. Do not remove tape strips completely unless your doctor says it is okay.  Check your cuts every day for signs of infection. Check for:  More redness, swelling, or pain.  More fluid or  blood.  Warmth.  Pus or a bad smell. Other Instructions  If you were sent home with a drain, follow instructions from your doctor about how to use it and care for it.  Take deep breaths. This helps to keep your lungs from getting swollen (inflamed).  To help with constipation:  Drink plenty of fluids.  Eat plenty of fruits and vegetables.  Keep all follow-up visits as told by your doctor. This is important. Contact a doctor if:  You have more redness, swelling, or pain around a cut from surgery.  You have more fluid or blood coming from a cut.  Your cut feels warm to the touch.  You have pus or a bad smell coming from a cut or a bandage.  The edges of a cut break open after the stitches have been taken out.  You have pain in your shoulders that gets worse.  You feel dizzy or you pass out (faint).  You have shortness of breath.  You keep feeling sick to your stomach (nauseous).  You keep throwing up (vomiting).  You get diarrhea or you cannot control your poop.  You lose your appetite.  You have swelling or pain in your legs. Get help right away if:  You have a fever.  You get a rash.  You have trouble breathing.  You have sharp pains in your chest. This information is not intended to replace advice  given to you by your health care provider. Make sure you discuss any questions you have with your health care provider. Document Released: 07/28/2009 Document Revised: 03/08/2016 Document Reviewed: 03/21/2015  2017 [employer]

## 2016-11-09 NOTE — Discharge Summary (Signed)
Physician Discharge Summary  Patient ID: Tyrone Sanchez MRN: [id number] DOB/AGE: 1965/10/31 51 y.o.  Admit date: 11/08/2016 Discharge date: 11/09/2016  Admission Diagnoses:Acute appendicitis  Discharge Diagnoses: Same Active Problems:   Acute appendicitis   Discharged Condition: good  Hospital Course: Patient is a 51 year old white male who presented to the emergency room with a less than 24-hour history of worsening right lower quadrant abdominal pain. CT scan of the abdomen revealed acute appendicitis. The patient was taken to the operating room on 11/08/2016 and underwent laparoscopic appendectomy. He tolerated the procedure well. His postoperative course has been unremarkable. His diet was advanced without difficulty. His leukocytosis has been resolving. Patient is being discharged home on 11/09/2016 in good and improving condition.  Treatments: surgery: Laparoscopic appendectomy on 11/08/2016  Discharge Exam: Blood pressure 108/67, pulse 76, temperature 98.9 F (37.2 C), temperature source Oral, resp. rate 14, height 6' (1.829 m), weight 100.1 kg (220 lb 11.2 oz), SpO2 94 %. General appearance: alert, cooperative and no distress Resp: clear to auscultation bilaterally Cardio: regular rate and rhythm, S1, S2 normal, no murmur, click, rub or gallop GI: Soft, dressings dry and intact.  Disposition: home  Discharge Instructions    Increase activity slowly    Complete by:  As directed      Allergies as of 11/09/2016   No Known Allergies     Medication List    TAKE these medications   ciprofloxacin 500 MG tablet Commonly known as:  CIPRO Take 1 tablet (500 mg total) by mouth 2 (two) times daily.   HYDROcodone-acetaminophen 5-325 MG tablet Commonly known as:  NORCO Take 1-2 tablets by mouth every 4 (four) hours as needed for moderate pain.      Follow-up Information    Dalia HeadingJENKINS,Flonnie Wierman A, MD. Schedule an appointment as soon as possible for a visit on 11/15/2016.   Specialty:   General Surgery Contact information: [address] [place] [id number] [phone]           Signed: Franky MachoJENKINS,Tyrone Norell A 11/09/2016, 8:32 AM

## 2016-11-09 NOTE — Addendum Note (Signed)
Addendum  created 11/09/16 0836 by Despina Hiddenobert J Maud Rubendall, CRNA   Sign clinical note

## 2016-11-09 NOTE — Anesthesia Postprocedure Evaluation (Signed)
Anesthesia Post Note  Patient: Tyrone Sanchez  Procedure(s) Performed: Procedure(s) (LRB): APPENDECTOMY LAPAROSCOPIC (N/A)  Patient location during evaluation: Nursing Unit Anesthesia Type: General Level of consciousness: awake and alert, oriented and patient cooperative Pain management: pain level controlled Vital Signs Assessment: post-procedure vital signs reviewed and stable Respiratory status: spontaneous breathing, nonlabored ventilation and respiratory function stable Cardiovascular status: blood pressure returned to baseline Postop Assessment: no signs of nausea or vomiting Anesthetic complications: no     Last Vitals:  Vitals:   11/09/16 0001 11/09/16 0359  BP: (!) 96/56 108/67  Pulse: 73 76  Resp: 16 14  Temp: 36.6 C 37.2 C    Last Pain:  Vitals:   11/09/16 0530  TempSrc:   PainSc: Asleep                 Ezella Kell J

## 2016-11-12 ENCOUNTER — Encounter (HOSPITAL_COMMUNITY): Payer: Self-pay | Admitting: General Surgery

## 2017-09-03 IMAGING — CT CT ABD-PELV W/ CM
2 of 5 series · 16 of 46 positions shown, 18 images · IV contrast (Isovue)
Comparison: None.

CLINICAL DATA: Patient with right lower quadrant pain.

EXAM:
CT ABDOMEN AND PELVIS WITH CONTRAST
TECHNIQUE: Multidetector CT imaging of the abdomen and pelvis was performed
using the standard protocol following bolus administration of
intravenous contrast.
CONTRAST:  100mL [id number] IOPAMIDOL (PCNF73-U00) INJECTION 61%

[Series 2: axial st · axial · 0.86mm/px · z∈[-514,-89]mm · 13 of 97 slices shown, 15 images]
[im 6/97  soft-tissue]
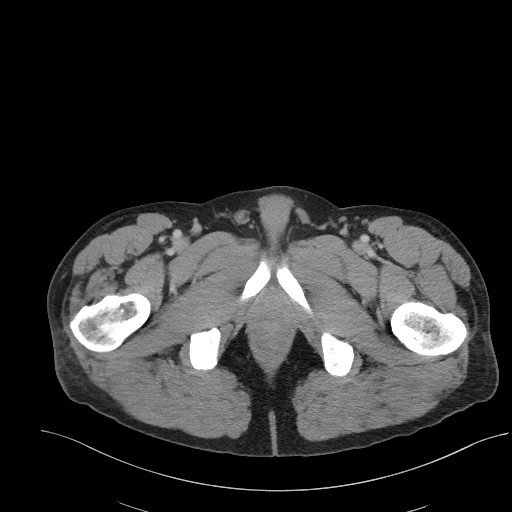
[im 6/97  bone]
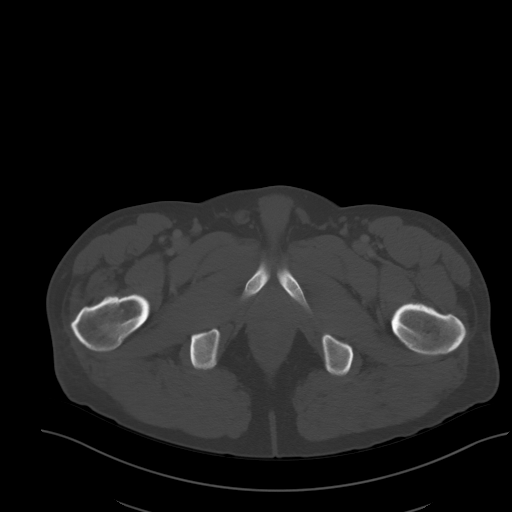
[im 11/97  soft-tissue]
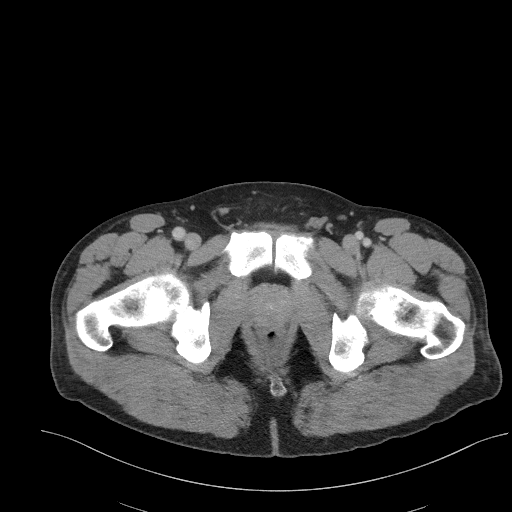
[im 22/97  soft-tissue]
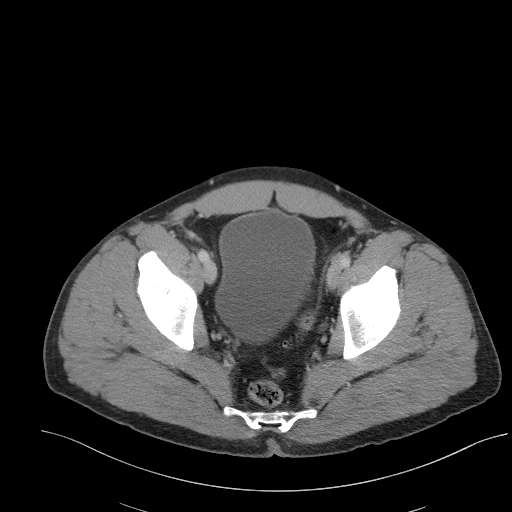
[im 27/97  soft-tissue]
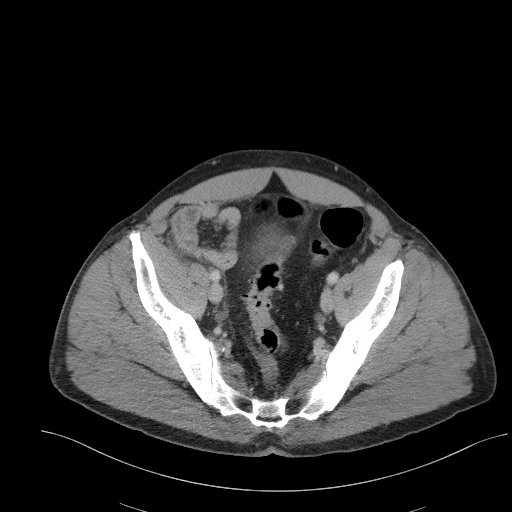
[im 33/97  soft-tissue]
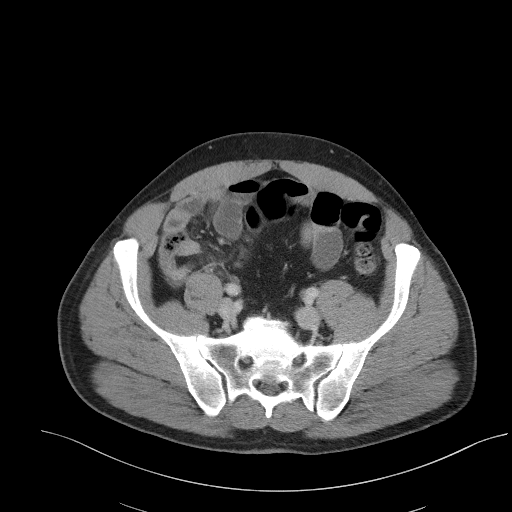
[im 43/97  soft-tissue]
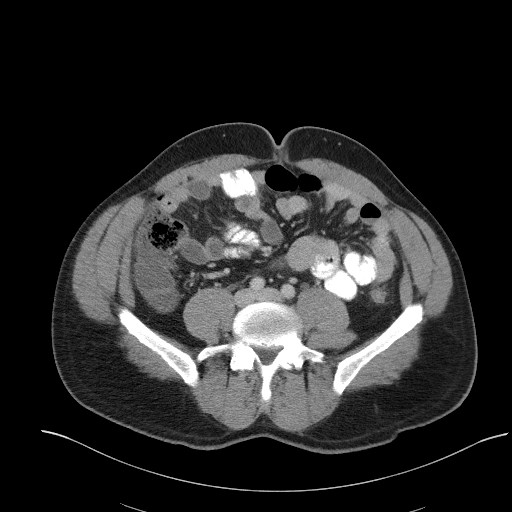
[im 49/97  soft-tissue]
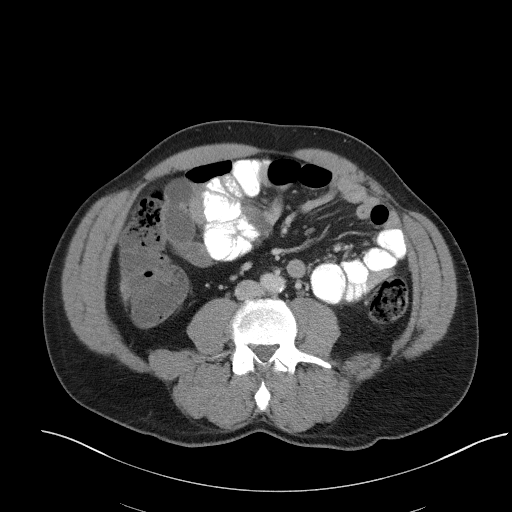
[im 54/97  soft-tissue]
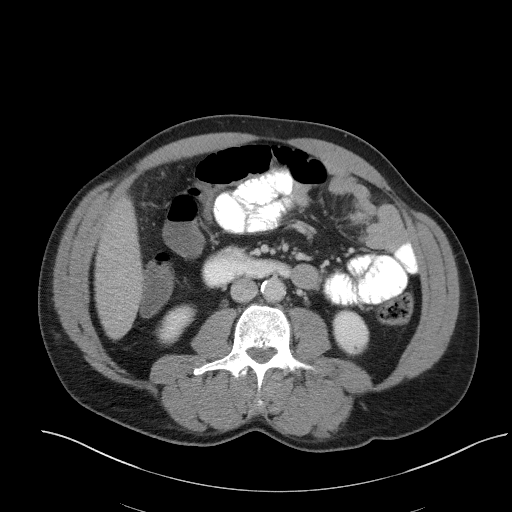
[im 65/97  soft-tissue]
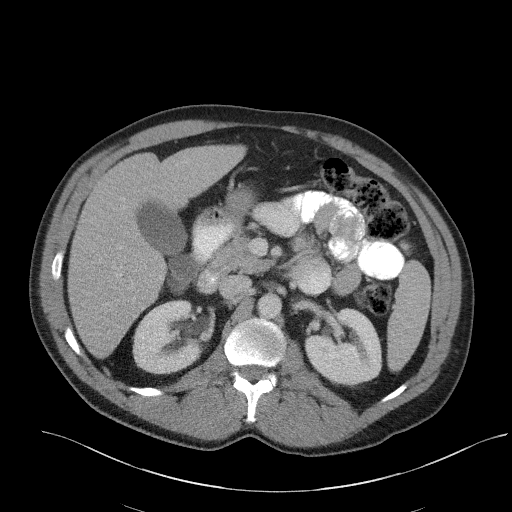
[im 65/97  bone]
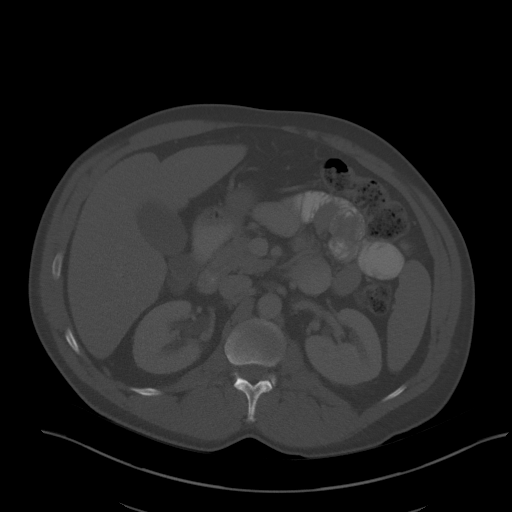
[im 70/97  soft-tissue]
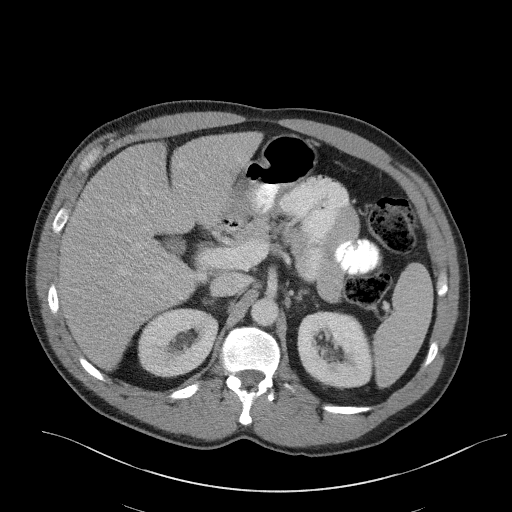
[im 75/97  soft-tissue]
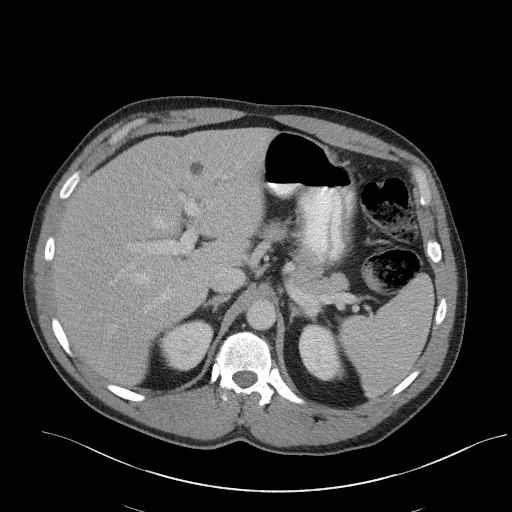
[im 86/97  soft-tissue]
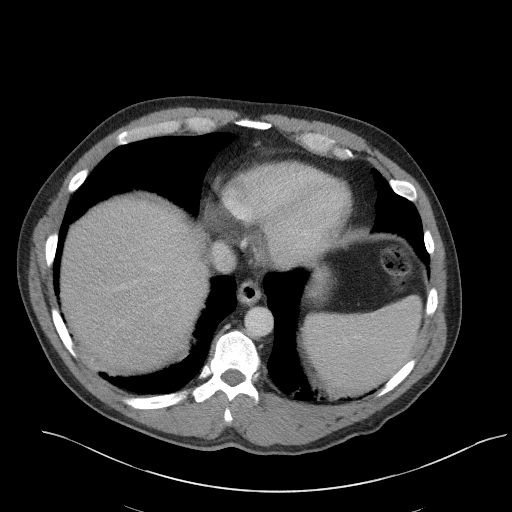
[im 91/97  soft-tissue]
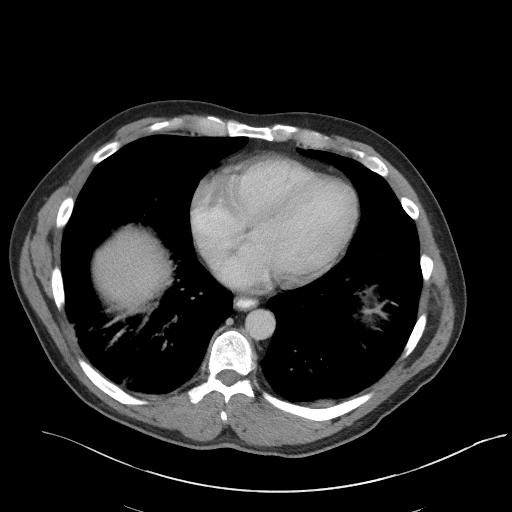

[Series 5: coronal st · coronal · 0.84mm/px · 3 of 103 slices shown]
[im 35/103  soft-tissue]
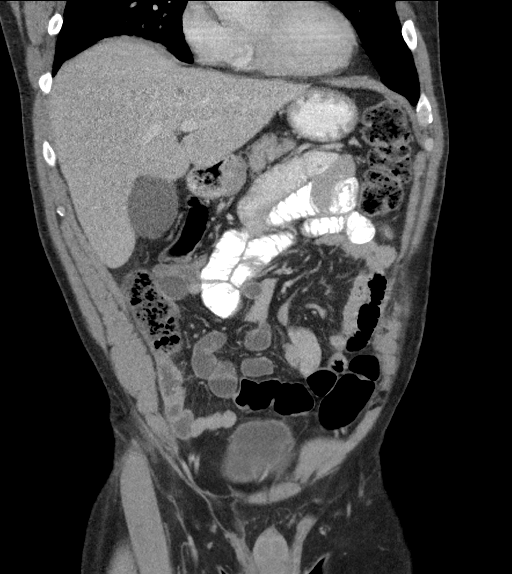
[im 46/103  soft-tissue]
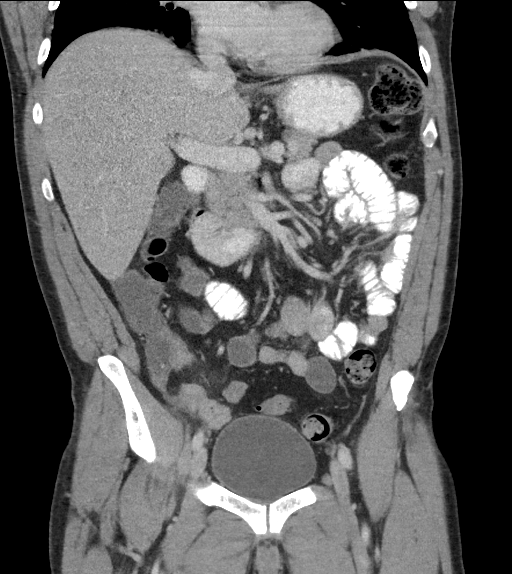
[im 57/103  soft-tissue]
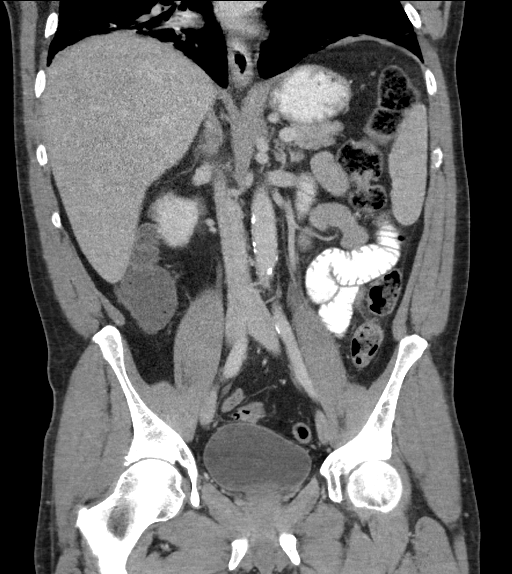

[16 of 46 positions shown; findings below may reference images not displayed]

FINDINGS: Lower chest: Normal heart size. Dependent ground-glass opacities
within the lower lobes bilaterally suggestive of atelectasis. In the
right lower lobe there is a 8 x 8 mm nodule (8 mm mean diameter) on
image 13 of series 3.

Hepatobiliary: Too small to characterize subcentimeter
low-attenuation lesions within the left hepatic lobe, potentially
representing small hepatic cysts (image 23; series 2). Gallbladder
is unremarkable. No intrahepatic or extrahepatic biliary ductal
dilatation.

Pancreas: Unremarkable

Spleen: Unremarkable

Adrenals/Urinary Tract: The adrenal glands are normal. Kidneys
enhance symmetrically with contrast. No hydronephrosis. Urinary
bladder is unremarkable.

Stomach/Bowel: The appendix is dilated and hyperenhancing measuring
up to 11 mm (image 49; series 5). There is a small amount of
surrounding fat stranding and fluid. Normal morphology of the
stomach. No free intraperitoneal air.

Vascular/Lymphatic: Normal caliber abdominal aorta. Peripheral
calcified atherosclerotic plaque. No retroperitoneal
lymphadenopathy.

Reproductive: Small right fat containing inguinal hernia.

Other: None.

Musculoskeletal: No aggressive or acute appearing osseous lesions.
Lumbar spine degenerative changes.
IMPRESSION: The appendix is hyperenhancing and mildly dilated with surrounding
fat stranding suggestive of acute appendicitis. No evidence for
perforation.

8 mm right lower lobe pulmonary nodule. Non-contrast chest CT at
6-12 months is recommended. If the nodule is stable at time of
repeat CT, then future CT at 18-24 months (from today's scan) is
considered optional for low-risk patients, but is recommended for
high-risk patients. This recommendation follows the consensus
statement: Guidelines for Management of Incidental Pulmonary Nodules
Detected on CT Images: From the [HOSPITAL] 9180; Radiology
9180; [DATE].

Aortic atherosclerosis.

Fat containing right inguinal hernia.
